# Patient Record
Sex: Male | Born: 1945 | Race: White | Hispanic: No | Marital: Single | Smoking: Former smoker
Health system: Southern US, Community
[De-identification: ages and names within clinical notes are randomized; demographics above are authoritative.]

## PROBLEM LIST (undated history)

## (undated) DIAGNOSIS — C439 Malignant melanoma of skin, unspecified: Secondary | ICD-10-CM

## (undated) DIAGNOSIS — Z9289 Personal history of other medical treatment: Secondary | ICD-10-CM

## (undated) DIAGNOSIS — E785 Hyperlipidemia, unspecified: Secondary | ICD-10-CM

## (undated) DIAGNOSIS — C801 Malignant (primary) neoplasm, unspecified: Secondary | ICD-10-CM

## (undated) DIAGNOSIS — J189 Pneumonia, unspecified organism: Secondary | ICD-10-CM

## (undated) HISTORY — PX: OTHER SURGICAL HISTORY: SHX169

## (undated) HISTORY — DX: Malignant melanoma of skin, unspecified: C43.9

## (undated) HISTORY — PX: APPENDECTOMY: SHX54

## (undated) HISTORY — PX: TONSILLECTOMY: SUR1361

## (undated) HISTORY — DX: Personal history of other medical treatment: Z92.89

## (undated) HISTORY — DX: Pneumonia, unspecified organism: J18.9

## (undated) HISTORY — DX: Hyperlipidemia, unspecified: E78.5

## (undated) HISTORY — PX: HEMORRHOID SURGERY: SHX153

## (undated) HISTORY — DX: Malignant (primary) neoplasm, unspecified: C80.1

---

## 2002-10-03 ENCOUNTER — Ambulatory Visit (HOSPITAL_BASED_OUTPATIENT_CLINIC_OR_DEPARTMENT_OTHER): Admission: RE | Admit: 2002-10-03 | Discharge: 2002-10-03 | Payer: Self-pay | Admitting: General Surgery

## 2005-01-22 ENCOUNTER — Encounter: Admission: RE | Admit: 2005-01-22 | Discharge: 2005-01-22 | Payer: Self-pay | Admitting: General Surgery

## 2005-01-22 ENCOUNTER — Ambulatory Visit (HOSPITAL_COMMUNITY): Admission: RE | Admit: 2005-01-22 | Discharge: 2005-01-22 | Payer: Self-pay | Admitting: General Surgery

## 2005-01-28 ENCOUNTER — Ambulatory Visit (HOSPITAL_COMMUNITY): Admission: RE | Admit: 2005-01-28 | Discharge: 2005-01-28 | Payer: Self-pay | Admitting: General Surgery

## 2005-01-28 ENCOUNTER — Ambulatory Visit (HOSPITAL_BASED_OUTPATIENT_CLINIC_OR_DEPARTMENT_OTHER): Admission: RE | Admit: 2005-01-28 | Discharge: 2005-01-28 | Payer: Self-pay | Admitting: General Surgery

## 2007-08-25 ENCOUNTER — Other Ambulatory Visit: Payer: Self-pay

## 2008-07-24 ENCOUNTER — Other Ambulatory Visit: Payer: Self-pay | Admitting: Anesthesiology

## 2008-07-24 ENCOUNTER — Ambulatory Visit: Payer: Self-pay | Admitting: Internal Medicine

## 2009-07-14 ENCOUNTER — Ambulatory Visit: Payer: Self-pay | Admitting: Cardiovascular Disease

## 2009-07-14 ENCOUNTER — Other Ambulatory Visit: Payer: Self-pay | Admitting: Anesthesiology

## 2011-11-05 IMAGING — CR DG CHEST 2V
1 series · 2 of 2 positions shown · non-contrast
Comparison: none

REASON FOR EXAM: cough [id number]
COMMENTS:

[Series 1: view not recorded · 0.17mm/px · 2 of 2 slices shown]
[im 1/2]
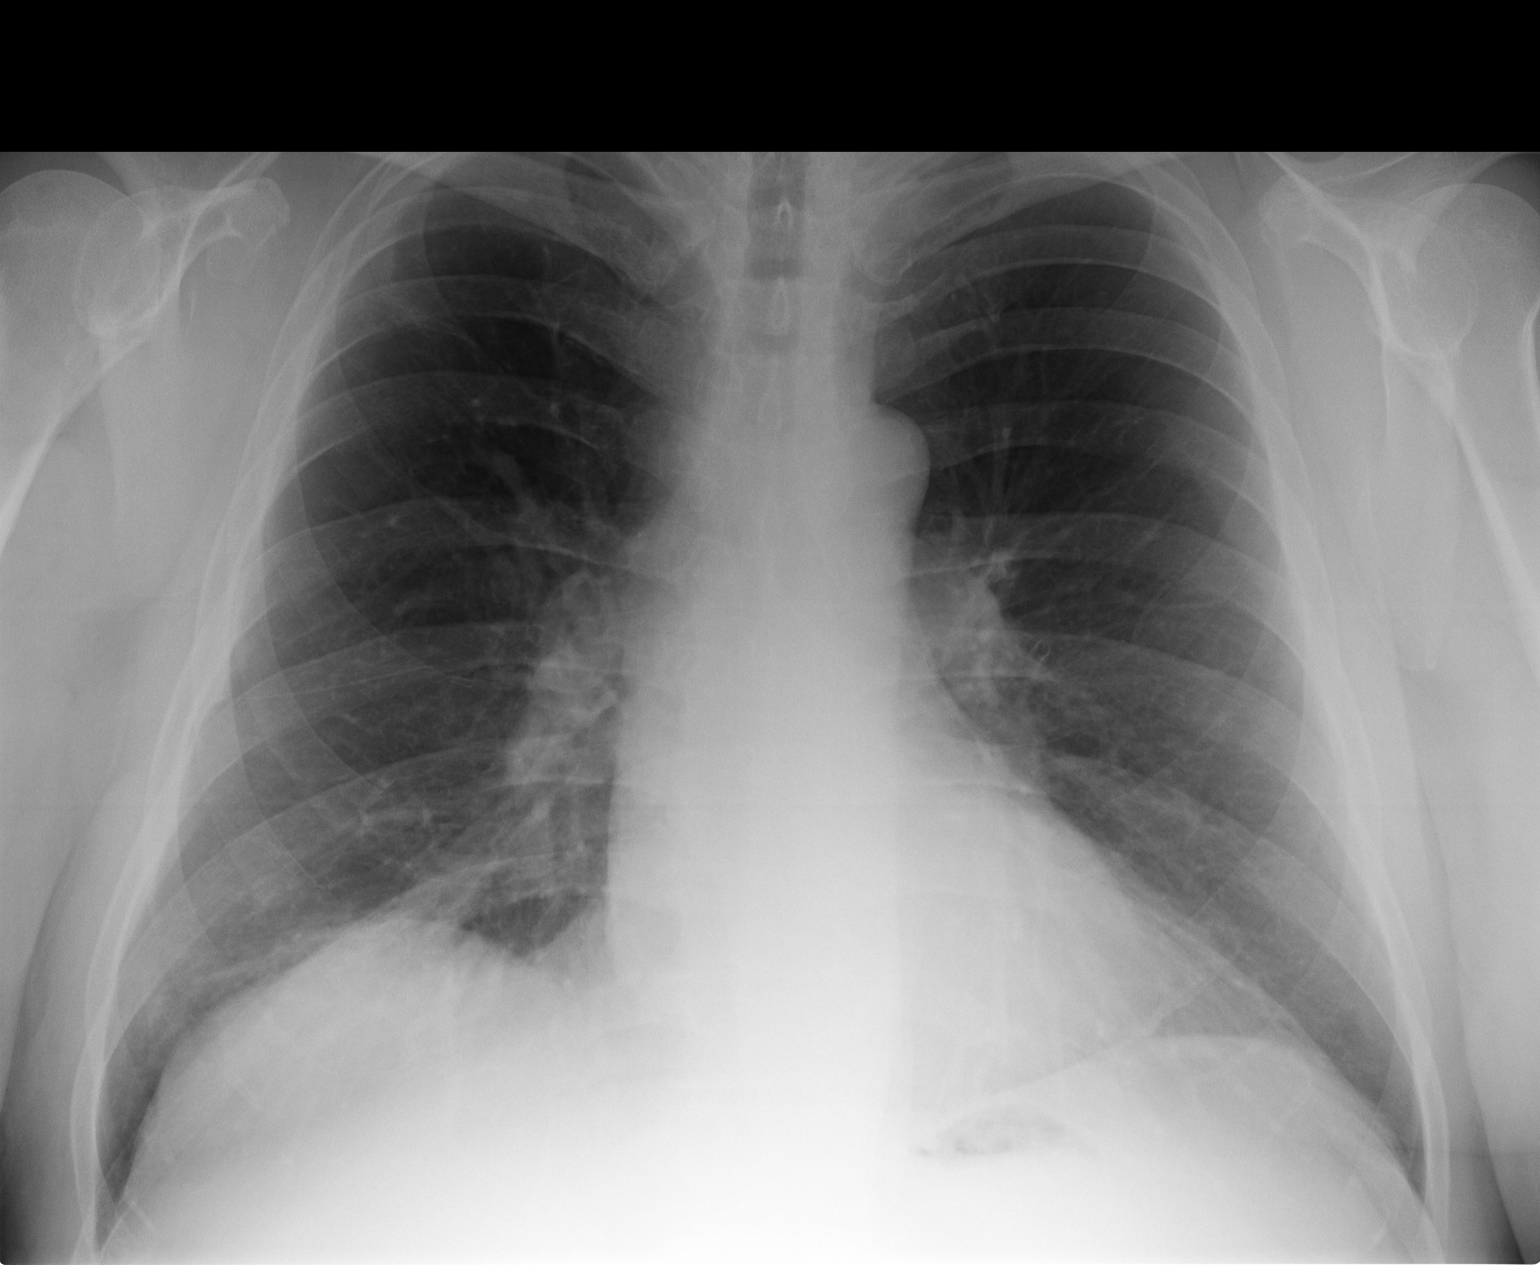
[im 2/2]
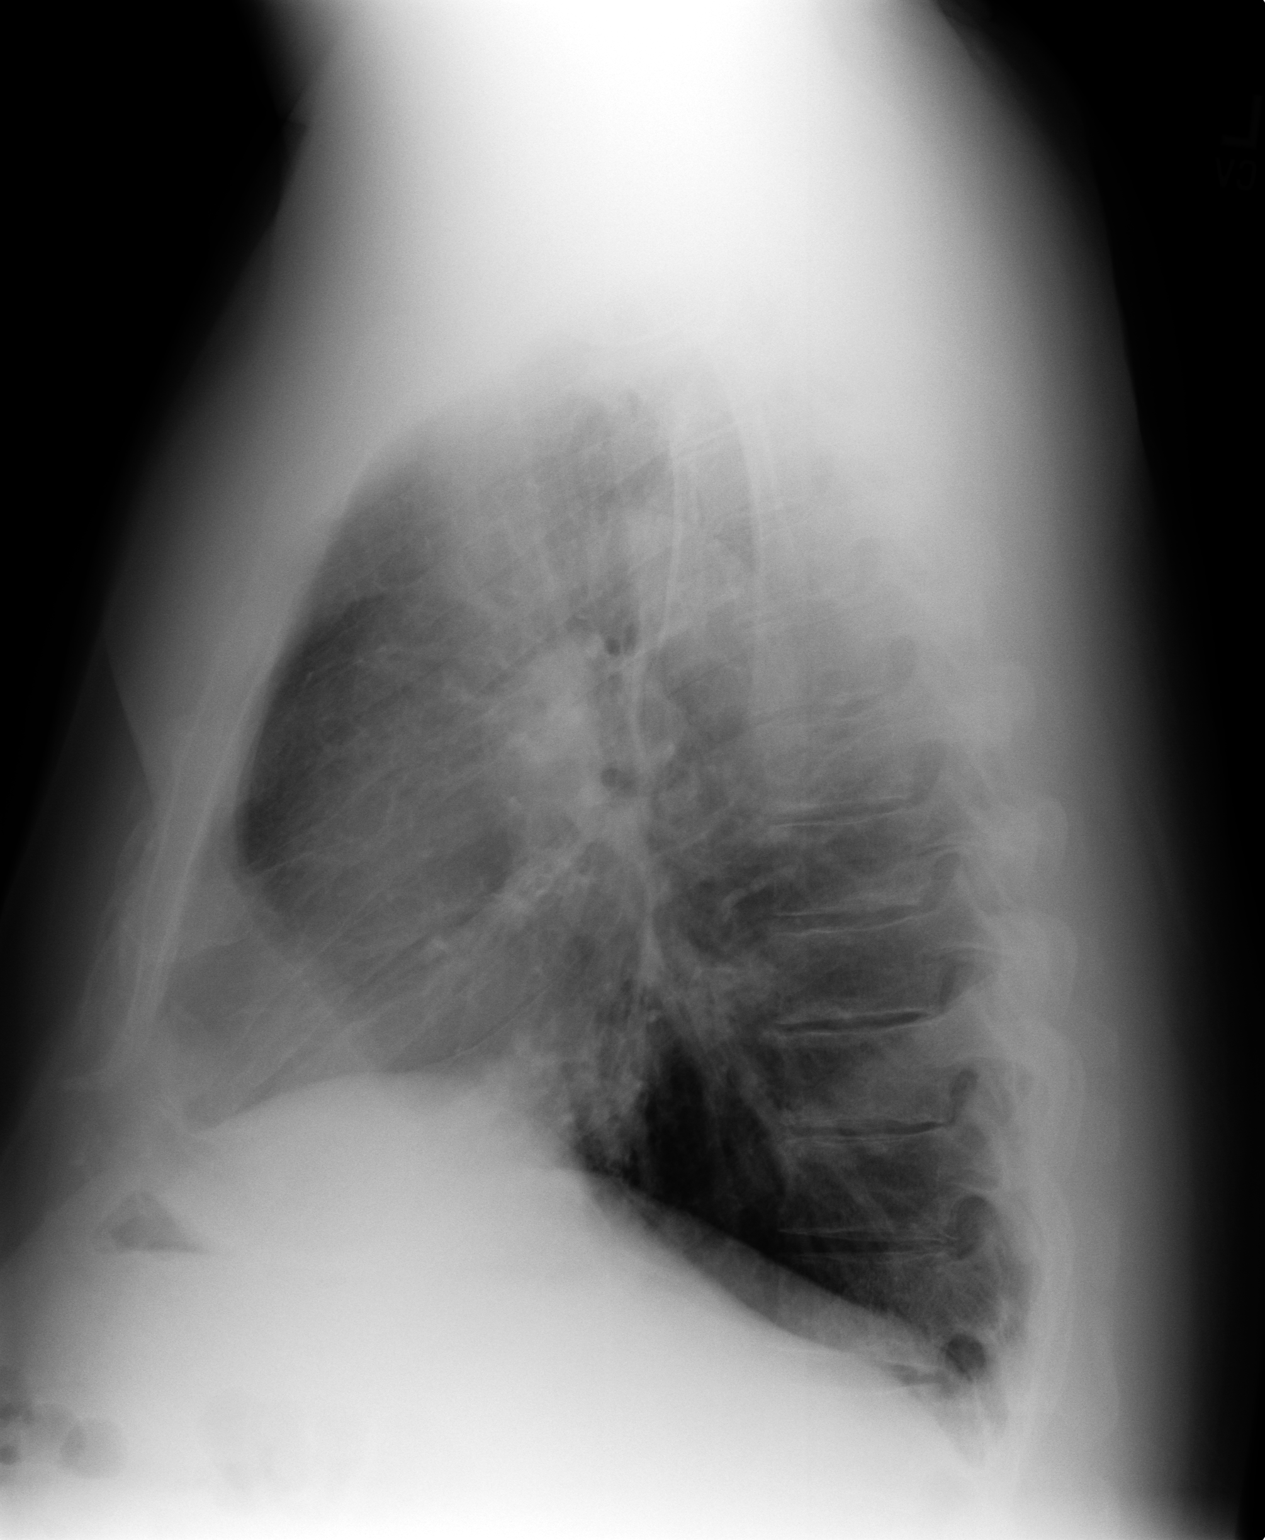

[2 of 2 positions shown; findings below may reference images not displayed]

PROCEDURE:     KDR - KDXR CHEST PA (OR AP) AND LAT  - March 03, 2010  [DATE]

RESULT:     Comparison is made to the prior exam of 01/06/2009. There is
thickening of the right basilar markings medially consistent with minimal
infiltrate or atelectasis. The left lung field is clear. Heart size is
normal. No pulmonary edema is seen. The osseous structures are normal in
appearance.
IMPRESSION: 1.     There is a minimal increase in density at the right base compatible
with pneumonia or atelectasis. Follow-up examination is suggested if
clinically indicated.

## 2012-01-31 IMAGING — CR DG CHEST 2V
1 series · 3 of 3 positions shown · non-contrast
Comparison: none

REASON FOR EXAM: cough call report [id number]
COMMENTS:

[Series 1: view not recorded · 0.17mm/px · 3 of 3 slices shown]
[im 1/3]
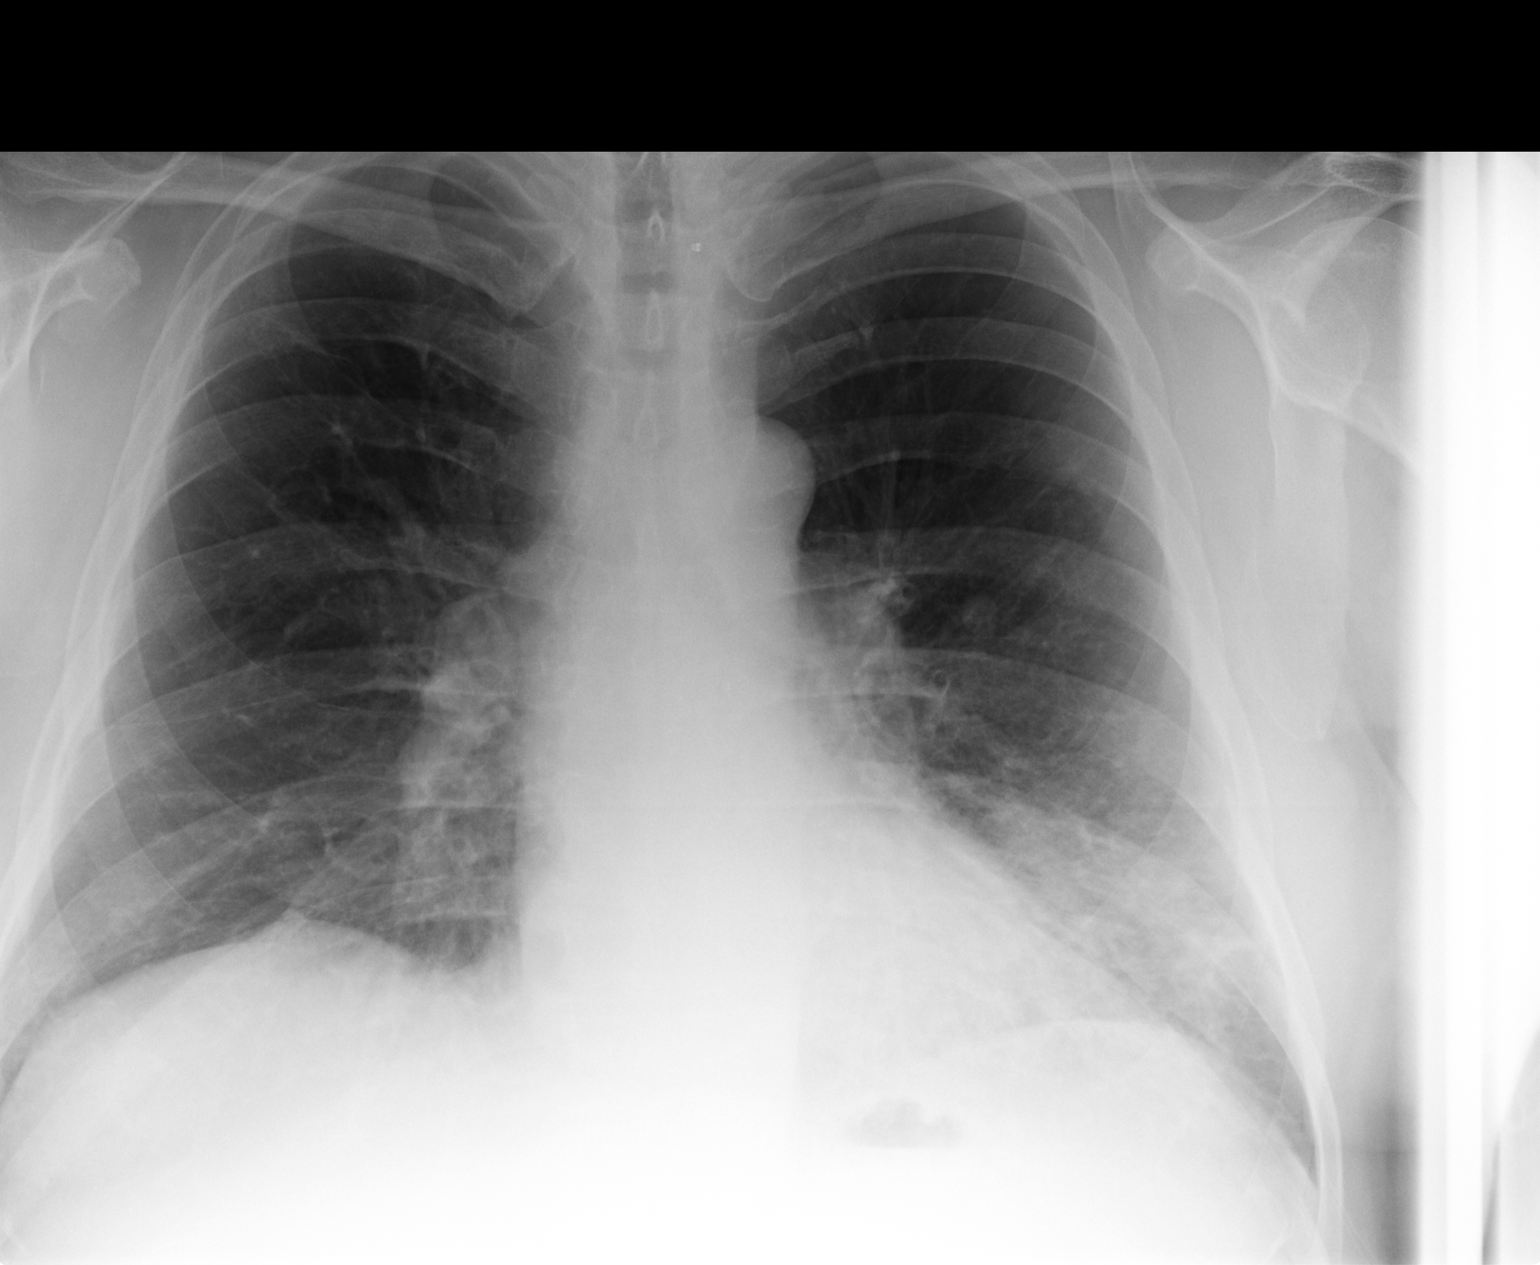
[im 2/3]
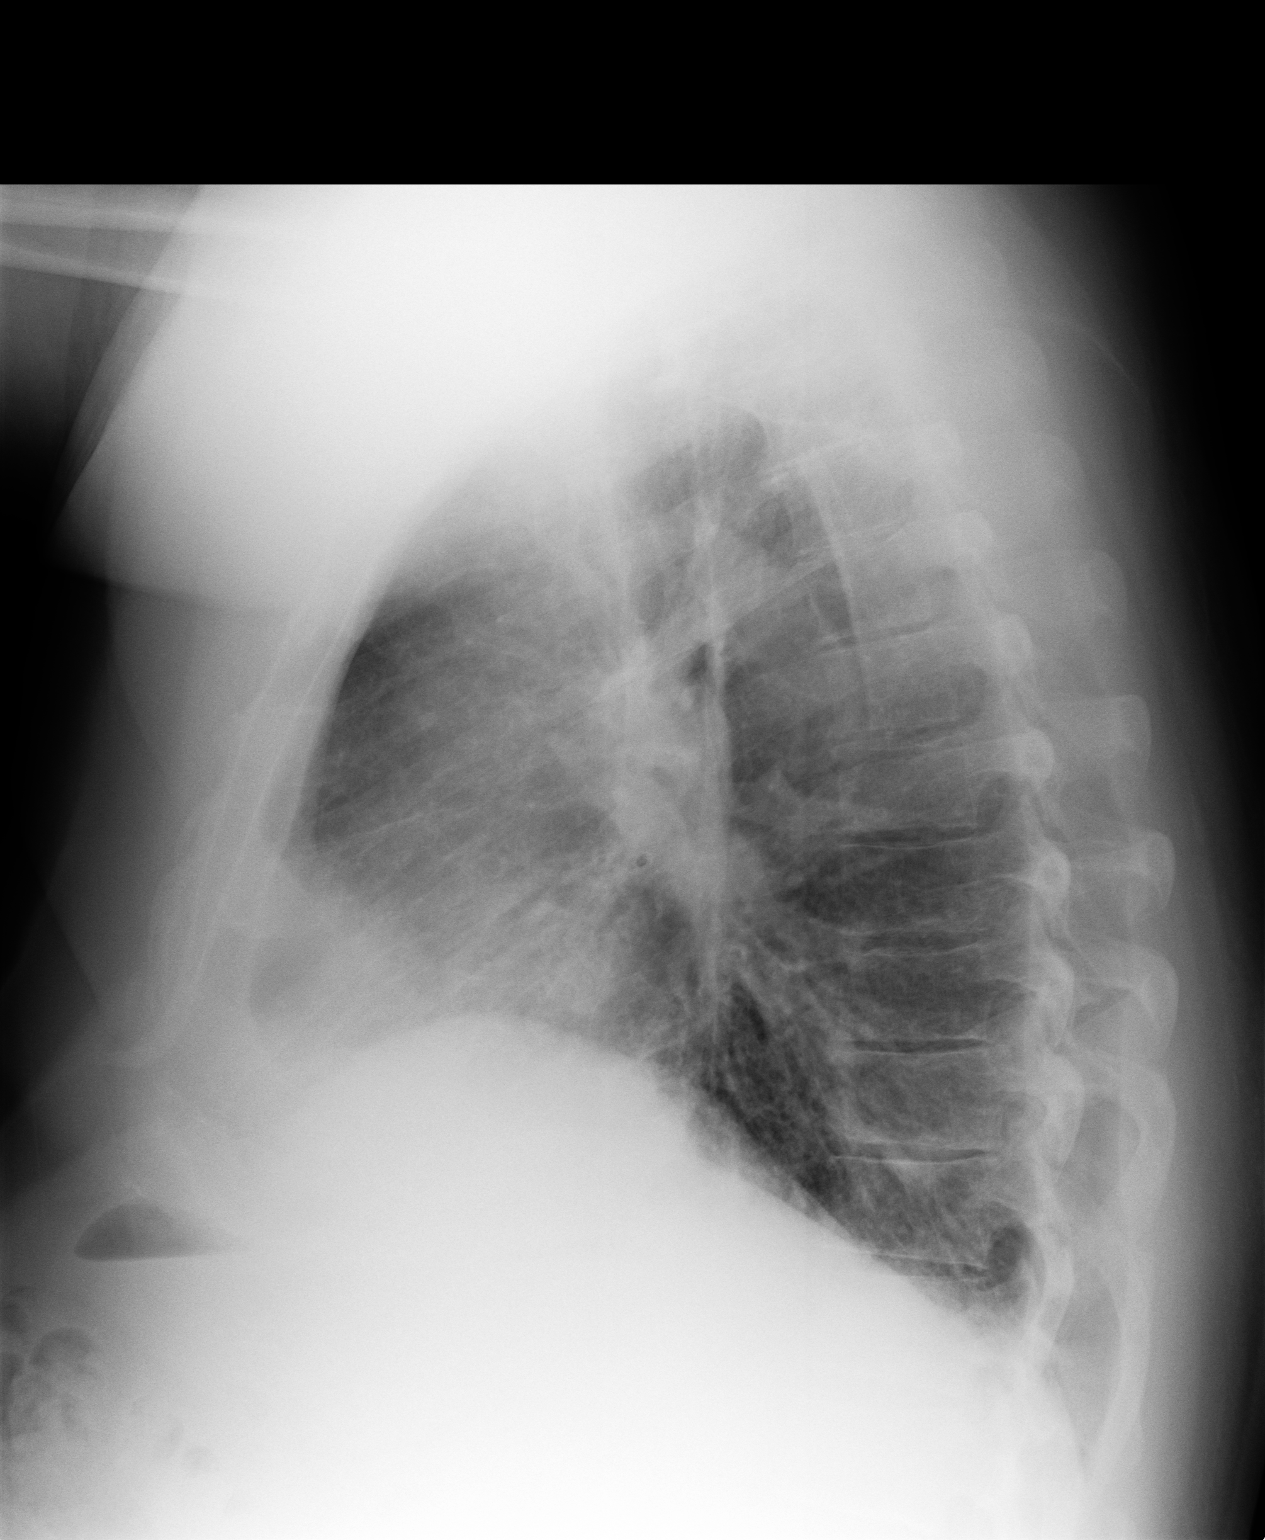
[im 3/3]
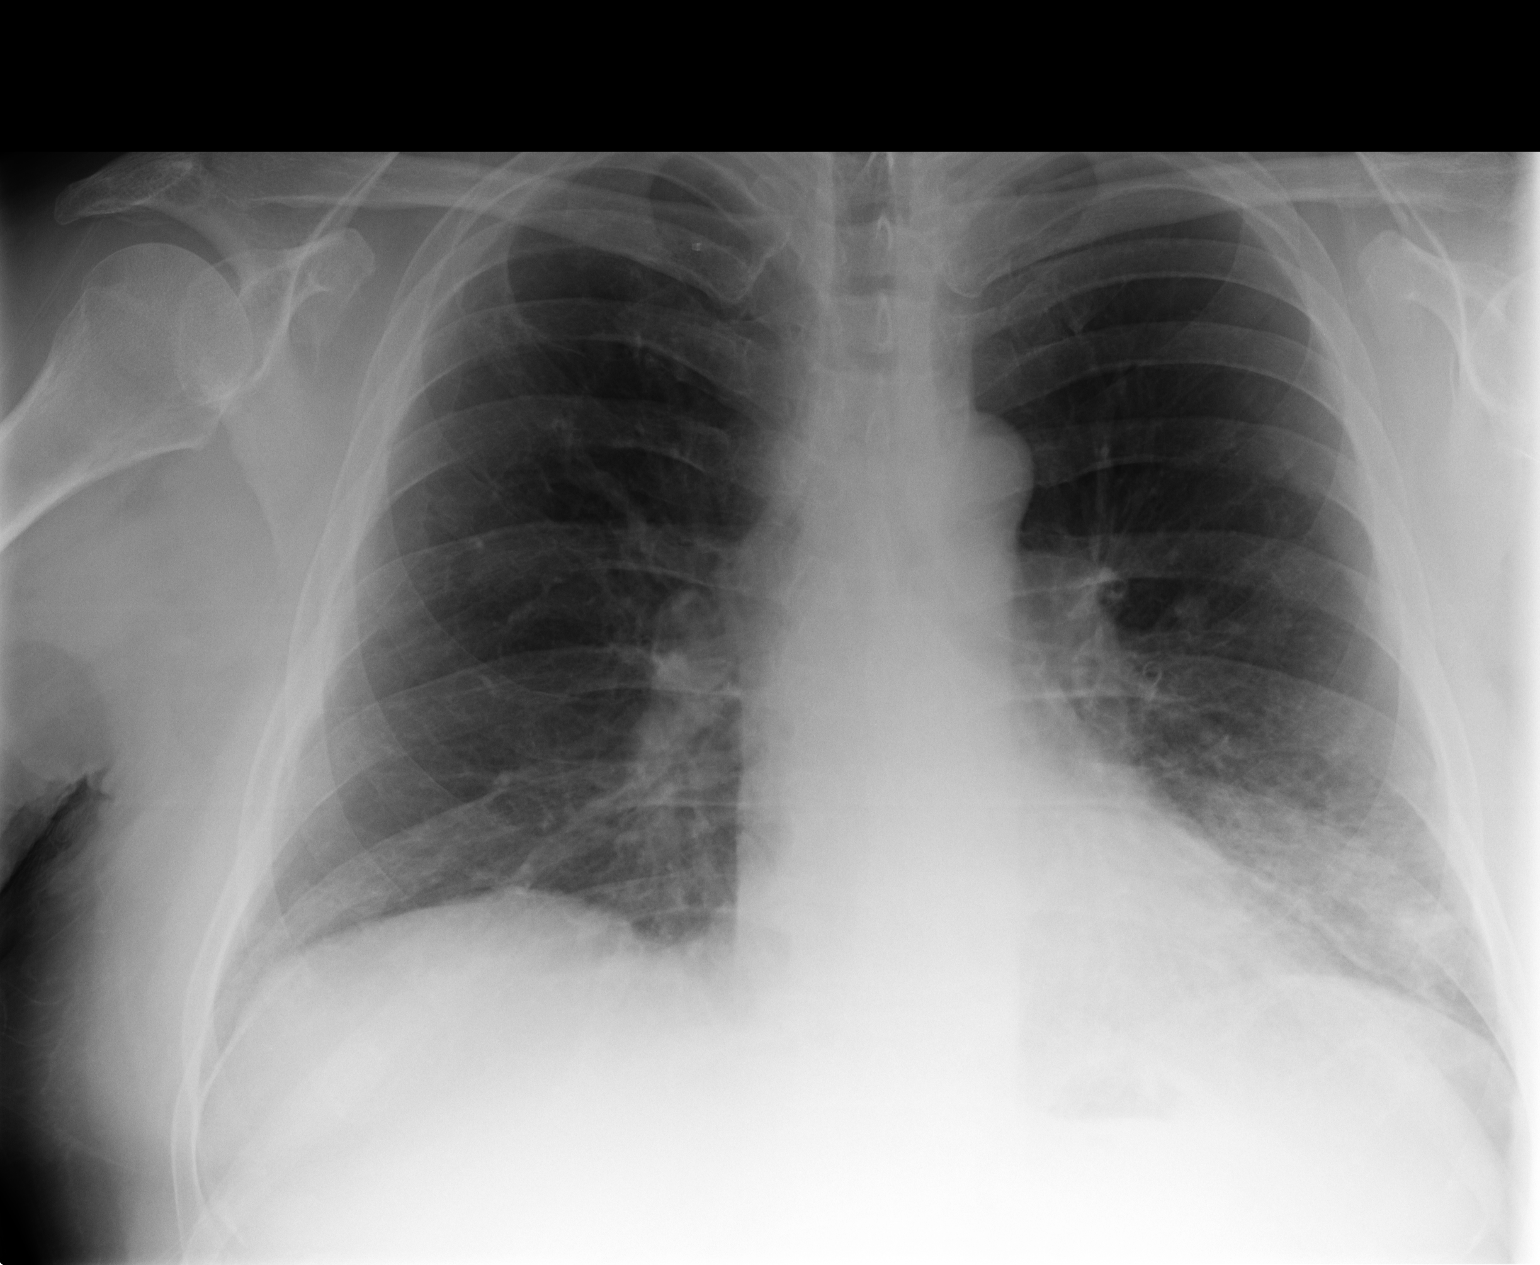

[3 of 3 positions shown; findings below may reference images not displayed]

PROCEDURE:     KDR - KDXR CHEST PA (OR AP) AND LAT  - May 29, 2010  [DATE]

RESULT:     The lungs are well-expanded. There is increased interstitial
density however in the left lower lobe when compared to the previous study
dated to March 2010. I see no pleural effusion. The cardiac silhouette is
normal in size.
IMPRESSION: There is patchy increased interstitial density in the left
lower lobe suspicious for pneumonia. Followup films following therapy are
recommended to assure complete clearing.

## 2013-03-18 IMAGING — CR DG CHEST 2V
1 series · 2 of 2 positions shown · non-contrast
Comparison: none

REASON FOR EXAM: SOB
COMMENTS:

PROCEDURE:     DXR - DXR CHEST PA (OR AP) AND LATERAL  - July 15, 2011 [DATE]
RESULT:     Comparison: 06/08/2010

[Series 1: w chest pa · 0.14mm/px · 2 of 2 slices shown]
[im 1/2]
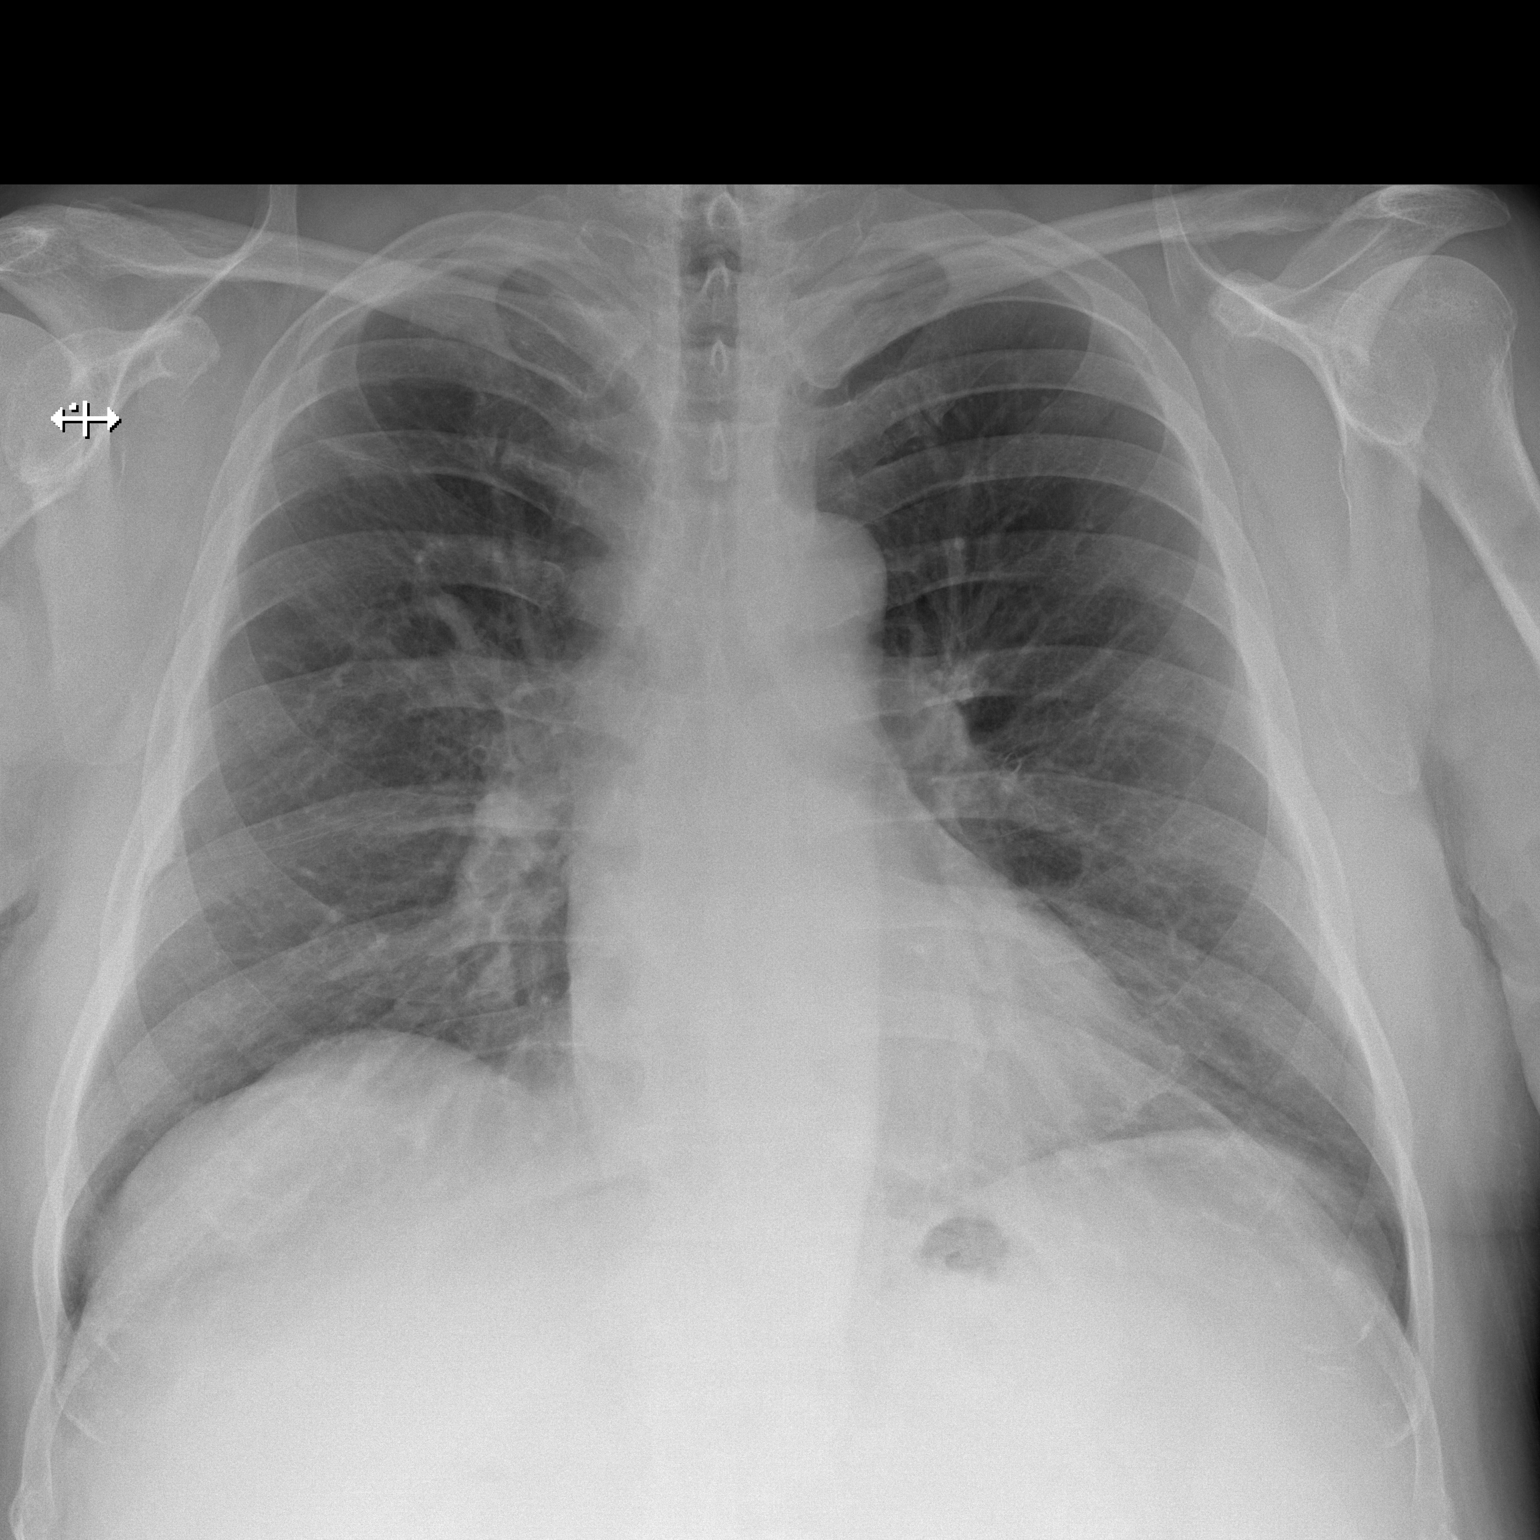
[im 2/2]
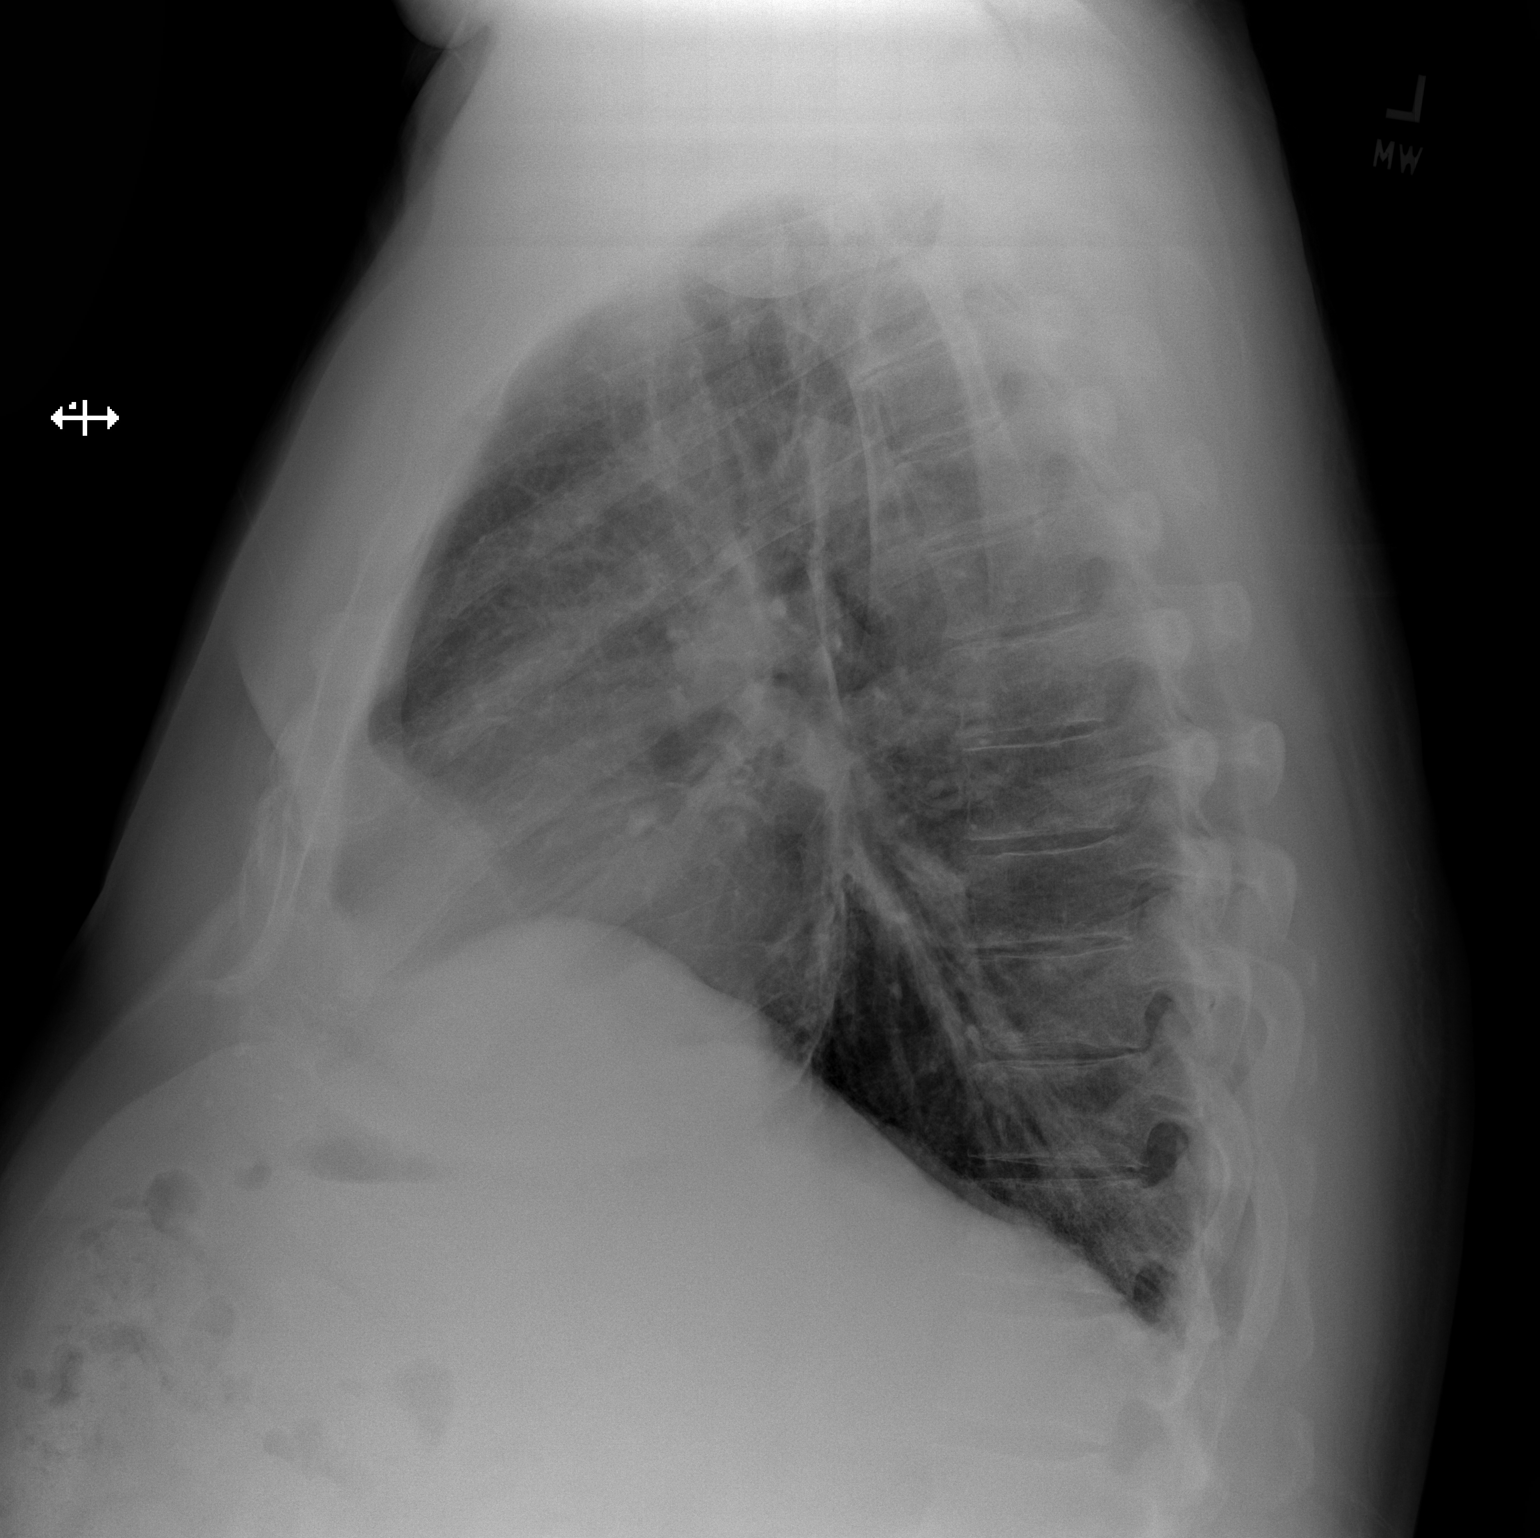

[2 of 2 positions shown; findings below may reference images not displayed]

FINDINGS: Heart and mediastinum are within normal limits. Subtle linear density in the
periphery of the right upper lung is similar to prior and likely secondary
to scarring. Otherwise, the lungs are clear.
IMPRESSION: No acute cardiopulmonary disease.

## 2013-08-31 ENCOUNTER — Encounter: Payer: Self-pay | Admitting: General Surgery

## 2013-08-31 ENCOUNTER — Other Ambulatory Visit: Payer: Self-pay | Admitting: General Surgery

## 2013-08-31 ENCOUNTER — Ambulatory Visit (INDEPENDENT_AMBULATORY_CARE_PROVIDER_SITE_OTHER): Payer: Medicare Other

## 2013-08-31 ENCOUNTER — Ambulatory Visit: Payer: Medicare Other | Admitting: General Surgery

## 2013-08-31 VITALS — BP 110/64 | HR 88 | Resp 20 | Ht 70.0 in | Wt 243.0 lb

## 2013-08-31 DIAGNOSIS — R590 Localized enlarged lymph nodes: Secondary | ICD-10-CM

## 2013-08-31 DIAGNOSIS — C439 Malignant melanoma of skin, unspecified: Secondary | ICD-10-CM | POA: Insufficient documentation

## 2013-08-31 NOTE — Progress Notes (Signed)
Patient ID: Aaron Jasmin., male   DOB: [birth year], 68 y.o.   MRN: [id number]  Chief Complaint  Patient presents with  . Procedure    left axillary biopsy    HPI Aaron Spears. is a 68 y.o. male who presents for a left axillary biopsy. The patient has previously been treated for melanoma involving the chest with extension to the right axilla. The original primary was on the left medial chest. 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  Recent CT scan showed an enlarged lymph node in the left axilla. Biopsy was requested by his treating oncologist. The patient is accompanied today by his wife, Aaron Spears HPI  Past Medical History  Diagnosis Date  . Hyperlipidemia   . Cancer 2012/2013    leukemia  . Melanoma 2005-2015  . Pneumonia   . History of blood transfusion     Past Surgical History  Procedure Laterality Date  . Appendectomy    . Melanoma removal      numerous  . Tonsillectomy    . Hemorrhoid surgery      Family History  Problem Relation Age of Onset  . Cancer Mother     colon    Social History History  Substance Use Topics  . Smoking status: Former Smoker -- 15 years  . Smokeless tobacco: Never Used  . Alcohol Use: Yes    No Known Allergies  Current Outpatient Prescriptions  Medication Sig Dispense Refill  . albuterol (PROVENTIL HFA;VENTOLIN HFA) 108 (90 BASE) MCG/ACT inhaler Inhale 1 puff into the lungs every 6 (six) hours as needed for wheezing or shortness of breath.      Marland Kitchen albuterol (PROVENTIL) (5 MG/ML) 0.5% nebulizer solution Take 2.5 mg by nebulization every 6 (six) hours as needed for wheezing or shortness of breath.      . allopurinol (ZYLOPRIM) 100 MG tablet Take 1 tablet by mouth daily.      . cetirizine (ZYRTEC) 10 MG tablet Take 10 mg by mouth daily.      . dapsone 100 MG tablet Take 100 mg by mouth daily.      . fluticasone (FLONASE) 50 MCG/ACT nasal spray  Place 1 spray into both nostrils daily.      . furosemide (LASIX) 40 MG tablet Take 1 tablet by mouth daily.      . magnesium oxide (MAG-OX) 400 MG tablet Take 400 mg by mouth 3 (three) times daily.      . methotrexate (RHEUMATREX) 2.5 MG tablet Take 1 tablet by mouth daily.      Marland Kitchen moxifloxacin (AVELOX) 400 MG tablet Take 400 mg by mouth daily at 8 pm.      . polycarbophil (FIBERCON) 625 MG tablet Take 625 mg by mouth daily.      . potassium chloride (K-DUR) 10 MEQ tablet Take 1 tablet by mouth daily.      . pravastatin (PRAVACHOL) 20 MG tablet Take 20 mg by mouth daily.      . sertraline (ZOLOFT) 25 MG tablet Take 25 mg by mouth daily.      . vitamin B-12 (CYANOCOBALAMIN) 100 MCG tablet Take 100 mcg by mouth daily.       No current facility-administered medications for this visit.    Review of Systems Review of Systems  Constitutional: Negative.   Respiratory: Positive for cough.   Cardiovascular: Negative.     Blood pressure 110/64, pulse 88, resp.  rate 20, height 5\' 10"  (1.778 m), weight 243 lb (110.224 kg).  Physical Exam Physical Exam  Constitutional: He is oriented to person, place, and time. He appears well-developed and well-nourished.  Neck: Neck supple. No thyromegaly present.  Cardiovascular: Normal rate, regular rhythm and normal heart sounds.   No murmur heard. Pulmonary/Chest: Effort normal and breath sounds normal.  Lymphadenopathy:    He has no cervical adenopathy.  Neurological: He is alert and oriented to person, place, and time.  Skin: Skin is warm and dry.    Data Reviewed CT scan of the chest without contrast dated 08/27/2013 showed a 2-3 cm left axillary lymph node as well as smaller axillary lymph node. Additional findings detailed in the report.  Laboratory studies of the same date showed a hemoglobin of 9.8, white blood cell count 8500, platelet count 117,000. Normal electrolytes. Submitted GFR 55, creatinine 1.32.  Ultrasound examination of the left  axilla shows a dominant lymph node measuring 2 x 2 x 2.3 cm. Normal echo architecture was preserved.  The patient was amenable to core biopsy. 10 cc of 0.5% Xylocaine with 0.25% Marcaine with 1-200,000 units of epinephrine was utilized well tolerated. Prep was applied to the skin. A 14-gauge Finesse device was passed and multiple samples taken to the cortex as well as hilum. Scant bleeding was noted. The skin defect was closed with benzoin and Steri-Strips followed by Telfa and Tegaderm dressing. The procedure was well tolerated. The specimen was handcarried to pathology where touch preps were negative.   Assessment    New left axillary lymphadenopathy. Previous history of malignant melanoma. 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 He'll make use of ice intermittently through the course of the day. He will be contacted when pathology is available.       Robert Bellow 08/31/2013, 11:38 AM

## 2013-09-05 ENCOUNTER — Telehealth: Payer: Self-pay | Admitting: General Surgery

## 2013-09-05 NOTE — Telephone Encounter (Signed)
MICKI Pfannenstiel RETURNED YOUR CALL.

## 2013-09-06 ENCOUNTER — Other Ambulatory Visit: Payer: Self-pay | Admitting: Internal Medicine

## 2013-09-07 LAB — PATHOLOGY

## 2013-09-30 DEATH — deceased

## 2014-01-31 LAB — IMMUNOHISTOCHEMICAL STAIN(S)

## 2015-05-01 IMAGING — CT CT CHEST W/O CM
1 series · 1 of 1 positions shown · non-contrast
Comparison: Chest CT 06/19/2013.

CLINICAL DATA: Persistent productive cough. Shortness of breath.
History of melanoma.

EXAM:
CT CHEST WITHOUT CONTRAST
TECHNIQUE: Multidetector CT imaging of the chest was performed following the
standard protocol without IV contrast.

[Series 1: topogram 0.6 t20f · coronal · 1.00mm/px · 1 of 1 slices shown]
[im 1/1]
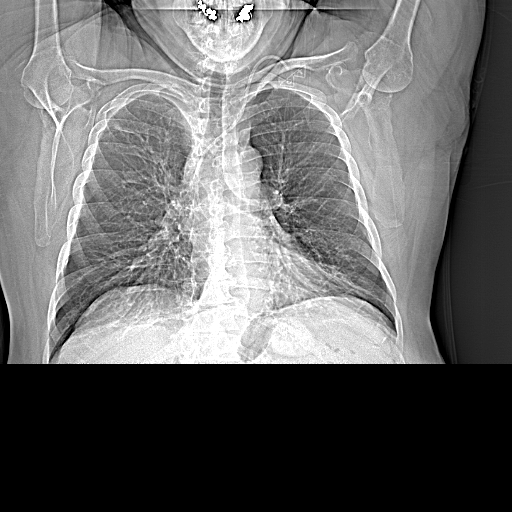

[1 of 1 positions shown; findings below may reference images not displayed]

FINDINGS: Mediastinum: Heart size is normal. Small amount of calcification of
the pericardium overlying the right ventricle anteriorly and at the
acute margin of the heart. This is unlikely to be associated with
constriction, as there are no additional imaging findings to suggest
constrictive physiology at this time. No significant pericardial
fluid or thickening. There is atherosclerosis of the thoracic aorta,
the great vessels of the mediastinum and the coronary arteries,
including calcified atherosclerotic plaque in the left main, left
anterior descending and left circumflex coronary arteries. No
pathologically enlarged mediastinal or hilar lymph nodes. Please
note that accurate exclusion of hilar adenopathy is limited on
noncontrast CT scans. Esophagus is unremarkable in appearance.

Lungs/Pleura: Diffuse bronchial wall thickening. Patchy areas of
mild cylindrical and varicose bronchiectasis, with extensive mucous
plugging and marked thickening of the peribronchovascular
interstitium. In the areas of greatest involvement, there is also
extensive peribronchovascular micro and macronodularity with some
associated architectural distortion. This is most prevalent in the
inferior segment of the lingula and in the lower lobes of the lungs
bilaterally. Overall, the appearance is very similar to the prior
study, although there is greater involvement of the right middle
lobe and lingula on today's examination. Previously described 6 mm
left lower lobe nodule (image 54 of series 3) is unchanged in
retrospect compared to prior study 06/05/2012, strongly favored to
be benign. No pleural effusions.

Upper Abdomen: Multiple liver lesions are noted. Some of these
appear slightly hyperdense (most likely related to internal
hemorrhage and/or Ceola) or partially calcified, including a 11 mm
lesion in segment 2 of the liver (image 50 of series 2), a lesion
with peripheral high attenuation in segment 4B (63 of series 2), and
an incompletely visualized lesion with peripheral high attenuation
in segment 6 (image 66 of series 2), as well as other ill-defined
low-attenuation lesions.

Musculoskeletal: Postoperative changes of axillary nodal dissection
on the right. On the left, there are numerous borderline enlarged
and enlarged lymph nodes, largest of which measures up to 2.3 cm
(image 10 of series 2), new compared to the prior study. Old
compression fracture of L1 with approximately 50% loss of anterior
vertebral body height is unchanged. There are no aggressive
appearing lytic or blastic lesions noted in the visualized portions
of the skeleton.
IMPRESSION: 1. The appearance the lungs is similar to the prior examination,
with some progression of underlying disease, as detailed above.
Findings remain compatible with a chronic indolent atypical
infection process such as SUNJIV (mycobacterium avium intracellulare).
2. Multiple hepatic lesions redemonstrated, but poorly evaluated on
today's non contrast CT examination. These presumably represent
multifocal hepatic metastases, several of which appear to
demonstrate internal hemorrhage and/or Ceola. This could be better
evaluated with MRI of the abdomen with and without IV gadolinium if
clinically appropriate.
3. Interval enlargement of a 2.3 cm left axillary lymph node, as
well as other smaller left axillary lymph nodes, concerning for
nodal metastases.
4. Trace amount of pericardial calcification. No other definite
imaging findings on today's examination to suggest constrictive
physiology.
5. Atherosclerosis, including left main and 2 vessel coronary artery
disease. Assessment for potential risk factor modification, dietary
therapy or pharmacologic therapy may be warranted, if clinically
indicated.
